# Patient Record
Sex: Female | Born: 1990 | Race: White | Hispanic: Yes | Marital: Single | Smoking: Never smoker
Health system: Southern US, Community
[De-identification: ages and names within clinical notes are randomized; demographics above are authoritative.]

## PROBLEM LIST (undated history)

## (undated) DIAGNOSIS — F419 Anxiety disorder, unspecified: Secondary | ICD-10-CM

## (undated) DIAGNOSIS — H539 Unspecified visual disturbance: Secondary | ICD-10-CM

## (undated) DIAGNOSIS — R569 Unspecified convulsions: Secondary | ICD-10-CM

## (undated) HISTORY — DX: Unspecified convulsions: R56.9

## (undated) HISTORY — DX: Unspecified visual disturbance: H53.9

## (undated) HISTORY — PX: WISDOM TOOTH EXTRACTION: SHX21

## (undated) HISTORY — PX: TONSILLECTOMY AND ADENOIDECTOMY: SUR1326

## (undated) HISTORY — DX: Anxiety disorder, unspecified: F41.9

---

## 2012-10-10 ENCOUNTER — Emergency Department: Payer: Self-pay | Admitting: Emergency Medicine

## 2013-02-22 ENCOUNTER — Emergency Department: Payer: Self-pay | Admitting: Emergency Medicine

## 2013-02-22 LAB — CBC
HCT: 38.6 % (ref 35.0–47.0)
HGB: 13.1 g/dL (ref 12.0–16.0)
MCH: 29.6 pg (ref 26.0–34.0)
MCHC: 34 g/dL (ref 32.0–36.0)
MCV: 87 fL (ref 80–100)
Platelet: 329 10*3/uL (ref 150–440)
WBC: 6.6 10*3/uL (ref 3.6–11.0)

## 2013-02-22 LAB — COMPREHENSIVE METABOLIC PANEL
Alkaline Phosphatase: 73 U/L (ref 50–136)
Anion Gap: 7 (ref 7–16)
BUN: 8 mg/dL (ref 7–18)
Bilirubin,Total: 0.5 mg/dL (ref 0.2–1.0)
Calcium, Total: 9.7 mg/dL (ref 8.5–10.1)
Chloride: 102 mmol/L (ref 98–107)
Co2: 27 mmol/L (ref 21–32)
EGFR (African American): >60
Osmolality: 272 (ref 275–301)
Sodium: 136 mmol/L (ref 136–145)
Total Protein: 7.7 g/dL (ref 6.4–8.2)

## 2013-02-22 LAB — URINALYSIS, COMPLETE
Bilirubin,UR: NEGATIVE
Blood: NEGATIVE
Nitrite: NEGATIVE
RBC,UR: 1 /HPF (ref 0–5)

## 2013-02-22 LAB — LIPASE, BLOOD: Lipase: 90 U/L (ref 73–393)

## 2013-02-22 LAB — WET PREP, GENITAL

## 2013-02-22 LAB — GC/CHLAMYDIA PROBE AMP

## 2013-07-01 ENCOUNTER — Ambulatory Visit: Payer: Self-pay | Admitting: Obstetrics and Gynecology

## 2013-07-08 ENCOUNTER — Ambulatory Visit: Payer: Self-pay | Admitting: Obstetrics and Gynecology

## 2013-08-08 ENCOUNTER — Ambulatory Visit: Payer: Self-pay | Admitting: Obstetrics and Gynecology

## 2013-08-18 ENCOUNTER — Ambulatory Visit: Payer: Self-pay | Admitting: Physician Assistant

## 2013-08-28 ENCOUNTER — Inpatient Hospital Stay: Payer: Self-pay

## 2013-08-28 LAB — CBC WITH DIFFERENTIAL/PLATELET
BASOS ABS: 0.1 10*3/uL (ref 0.0–0.1)
Basophil %: 1 %
EOS PCT: 0.9 %
Eosinophil #: 0.1 10*3/uL (ref 0.0–0.7)
HCT: 41.1 % (ref 35.0–47.0)
HGB: 13.8 g/dL (ref 12.0–16.0)
LYMPHS ABS: 1.9 10*3/uL (ref 1.0–3.6)
LYMPHS PCT: 27.9 %
MCH: 29.6 pg (ref 26.0–34.0)
MCHC: 33.6 g/dL (ref 32.0–36.0)
MCV: 88 fL (ref 80–100)
Monocyte #: 0.4 x10 3/mm (ref 0.2–0.9)
Monocyte %: 6 %
NEUTROS PCT: 64.2 %
Neutrophil #: 4.3 10*3/uL (ref 1.4–6.5)
PLATELETS: 305 10*3/uL (ref 150–440)
RBC: 4.66 10*6/uL (ref 3.80–5.20)
RDW: 12.8 % (ref 11.5–14.5)
WBC: 6.8 10*3/uL (ref 3.6–11.0)

## 2013-08-28 LAB — PROTEIN, URINE, 24 HOUR
Collection Hours: 15 hours
PROTEIN, 24 HOUR URINE: 1756 mg/(24.h) — AB (ref 30–149)
Protein, Urine: 118 mg/dL (ref 0–12)
Total Volume: 930 mL

## 2013-08-28 LAB — PROTEIN / CREATININE RATIO, URINE
Creatinine, Urine: 139.6 mg/dL — ABNORMAL HIGH (ref 30.0–125.0)
PROTEIN/CREAT. RATIO: 458 mg/g{creat} — AB (ref 0–200)
Protein, Random Urine: 64 mg/dL — ABNORMAL HIGH (ref 0–12)

## 2013-08-29 LAB — PLATELET COUNT: Platelet: 280 10*3/uL (ref 150–440)

## 2013-08-29 LAB — GC/CHLAMYDIA PROBE AMP

## 2013-08-30 LAB — HEMATOCRIT: HCT: 37.2 % (ref 35.0–47.0)

## 2013-10-11 LAB — HM PAP SMEAR: HM Pap smear: NORMAL
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
## 2014-08-05 ENCOUNTER — Ambulatory Visit: Payer: Self-pay | Admitting: Orthopedic Surgery

## 2014-12-16 NOTE — H&P (Signed)
L&D Evaluation:  History:  HPI 24 year old G1 P0 with EDC=09/16/2013 by LMP=12/10/2012 presents from office at 37 2/7 weeks for a PIH evaluation. Had BPs yesterday of 146/90 and 150/80 and +1 proteinuria. BPs today range from 114/67 to 150/84. [place] labs from 1/20 reveal H&H of 13.8/39.6, plt=316K, alk phos 726, ALT=61, AST=27, uric acid+5.8, BUN=10 and cr=0.6. She denies headaches, visual changes, RUQ pain, CP, or visual changes. AFI on 1/16 was 7.91 cm. NSTs have been reactive in office and here in L&D today. PNC at [facility] also remarkable for obesity (starting BMI=37), GDMA2 (currently takes glyburide q evening with supper) that has been fairly well controlled (has not been checking FSBS as often as she should recently), and a recent URI for which she is taking Amoxicillin 500 tid, Phenergan DM for cough for the past 5 days. The last growth scan at 35 weeks EFW at 6#2oz (53%). LABS: O POS, VI, RNI, GBS negative. TDAP given 12/1. Desires to breast feed   Presents with elevated BPs for PIH eval   Patient's Medical History Obesity, GDMA2, anxiety, mild depression    Patient's Surgical History Tonsilectomy    Medications Pre Natal Vitamins  Amoxicillin 500 mgm tid, Phenergan DM, glyburide 2.5 mgm with evening meal    Allergies Motrin-hives   Social History none    Family History Non-Contributory    ROS:  ROS see HPI   Exam:  Vital Signs 125/58, 116/71, 114/67, 150/84, 139/75, 123/61    Urine Protein 1+, P/C ratio was 458 mgm and a 24 hr protein extrapolated from a 15 hr specimen was 1756 mgm   General no apparent distress   Mental Status clear    Chest clear    Heart normal sinus rhythm, no murmur/gallop/rubs   Abdomen gravid, non-tender   Estimated Fetal Weight Average for gestational age, 7#   Fetal Position cephalic   Edema trace    Reflexes 1+    Pelvic no external lesions, 1.5cm/75%/-1 to 0   Mebranes Intact   FHT 140 baseline with accels to 160s-Cat1   Ucx irregular,  mild   Skin dry   Impression:  Impression reactive NST, IUP at 37 2/7 weeks with mild preeclampsia   Plan:  Plan other, Discussed with patient diagnosis and risks of continued observation vs the risks of IOL. Discussed possible sequelae from preeclampsia as well as risks of IOL including  hyperstimulation, FITL, failed IOL, C-section.   Comments Also discussed small risks of prematurity issues at 37 weeks with respiratory problems, poor sucking, glucose control. Patient agrees with moving her toward delivery. Plan to let he eat supper and insert Cervidil after supper. Check 2 hr pp after supper.Discussed POM with Dr Glennon Mac.   Electronic Signatures: Karene Fry (CNM)  (Signed 22-Jan-15 00:14)  Authored: L&D Evaluation   Last Updated: 22-Jan-15 00:14 by Karene Fry (CNM)

## 2017-01-05 ENCOUNTER — Ambulatory Visit (INDEPENDENT_AMBULATORY_CARE_PROVIDER_SITE_OTHER): Payer: Self-pay | Admitting: Neurology

## 2017-01-05 ENCOUNTER — Encounter (INDEPENDENT_AMBULATORY_CARE_PROVIDER_SITE_OTHER): Payer: Self-pay

## 2017-01-05 ENCOUNTER — Encounter: Payer: Self-pay | Admitting: Neurology

## 2017-01-05 DIAGNOSIS — R404 Transient alteration of awareness: Secondary | ICD-10-CM | POA: Insufficient documentation

## 2017-01-05 DIAGNOSIS — G4719 Other hypersomnia: Secondary | ICD-10-CM

## 2017-01-05 DIAGNOSIS — R0683 Snoring: Secondary | ICD-10-CM | POA: Insufficient documentation
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
## 2017-02-09 ENCOUNTER — Other Ambulatory Visit: Payer: Self-pay

## 2017-03-02 ENCOUNTER — Ambulatory Visit (INDEPENDENT_AMBULATORY_CARE_PROVIDER_SITE_OTHER): Payer: 59

## 2017-03-02 DIAGNOSIS — R4182 Altered mental status, unspecified: Secondary | ICD-10-CM | POA: Diagnosis not present

## 2017-03-02 DIAGNOSIS — R404 Transient alteration of awareness: Secondary | ICD-10-CM

## 2017-03-07 NOTE — Progress Notes (Signed)
   [facility]  EEG (ELECTROENCEPHALOGRAM) REPORT   STUDY DATE: 03/02/2017 PATIENT NAME: Kristen PoundKathleen Westerfield DOB: 06/13/1991 MRN: [id number]  ORDERING CLINICIAN: Richard A. Epimenio FootSater, MD. PhD  TECHNOLOGIST: Elvis CoilNick Willard TECHNIQUE: Electroencephalogram was recorded utilizing standard 10-20 system of lead placement and reformatted into average and bipolar montages.  RECORDING TIME: 22:18  CLINICAL INFORMATION: 26 year old woman with seizure-like activity  FINDINGS: A digital EEG was performed while the patient was awake and drowsy. While awake and most alert there was a 10.5 hz posterior dominant rhythm. Voltages and frequencies were symmetric.  There were no focal, lateralizing, epileptiform activity or seizures seen.  Photic stimulation had a normal driving response. Hyperventilation and recovery did not change the underlying rhythms. EKG channel shows NSR.   Stage N1 and N2 sleep was recorded.  IMPRESSION: This is a normal EEG with the patient was awake and asleep.   INTERPRETING PHYSICIAN:   Richard A. Epimenio FootSater, MD, PhD, [facility]AN Certified in Neurology, Clinical Neurophysiology, Sleep Medicine, Pain Medicine and Neuroimaging  [facility] [address], [address] [place], [place] [address] [phone]

## 2017-03-08 ENCOUNTER — Telehealth: Payer: Self-pay | Admitting: *Deleted

## 2017-03-08 NOTE — Telephone Encounter (Signed)
I have spoken with Kristen Ponce this afternoon and per RAS, advised EEG done in our office is normal.  She is not yet cleared to drive; will discuss further at next ov.  Pt. verbalized understanding of same/fim

## 2017-12-07 ENCOUNTER — Ambulatory Visit: Payer: Self-pay | Admitting: Advanced Practice Midwife

## 2017-12-08 ENCOUNTER — Ambulatory Visit (INDEPENDENT_AMBULATORY_CARE_PROVIDER_SITE_OTHER): Payer: 59 | Admitting: Advanced Practice Midwife

## 2017-12-08 ENCOUNTER — Encounter: Payer: Self-pay | Admitting: Advanced Practice Midwife

## 2017-12-08 VITALS — BP 124/76 | Ht 67.0 in | Wt 252.0 lb

## 2017-12-08 DIAGNOSIS — Z113 Encounter for screening for infections with a predominantly sexual mode of transmission: Secondary | ICD-10-CM

## 2017-12-08 DIAGNOSIS — Z124 Encounter for screening for malignant neoplasm of cervix: Secondary | ICD-10-CM | POA: Diagnosis not present

## 2017-12-08 DIAGNOSIS — Z01419 Encounter for gynecological examination (general) (routine) without abnormal findings: Secondary | ICD-10-CM | POA: Diagnosis not present

## 2017-12-08 NOTE — Patient Instructions (Addendum)
Health Maintenance, Female Adopting a healthy lifestyle and getting preventive care can go a long way to promote health and wellness. Talk with your health care provider about what schedule of regular examinations is right for you. This is a good chance for you to check in with your provider about disease prevention and staying healthy. In between checkups, there are plenty of things you can do on your own. Experts have done a lot of research about which lifestyle changes and preventive measures are most likely to keep you healthy. Ask your health care provider for more information. Weight and diet Eat a healthy diet  Be sure to include plenty of vegetables, fruits, low-fat dairy products, and lean protein.  Do not eat a lot of foods high in solid fats, added sugars, or salt.  Get regular exercise. This is one of the most important things you can do for your health. ? Most adults should exercise for at least 150 minutes each week. The exercise should increase your heart rate and make you sweat (moderate-intensity exercise). ? Most adults should also do strengthening exercises at least twice a week. This is in addition to the moderate-intensity exercise.  Maintain a healthy weight  Body mass index (BMI) is a measurement that can be used to identify possible weight problems. It estimates body fat based on height and weight. Your health care provider can help determine your BMI and help you achieve or maintain a healthy weight.  For females 69 years of age and older: ? A BMI below 18.5 is considered underweight. ? A BMI of 18.5 to 24.9 is normal. ? A BMI of 25 to 29.9 is considered overweight. ? A BMI of 30 and above is considered obese.  Watch levels of cholesterol and blood lipids  You should start having your blood tested for lipids and cholesterol at 26 years of age, then have this test every 5 years.  You may need to have your cholesterol levels checked more often if: ? Your lipid or  cholesterol levels are high. ? You are older than 26 years of age. ? You are at high risk for heart disease.  Cancer screening Lung Cancer  Lung cancer screening is recommended for adults 70-27 years old who are at high risk for lung cancer because of a history of smoking.  A yearly low-dose CT scan of the lungs is recommended for people who: ? Currently smoke. ? Have quit within the past 15 years. ? Have at least a 30-pack-year history of smoking. A pack year is smoking an average of one pack of cigarettes a day for 1 year.  Yearly screening should continue until it has been 15 years since you quit.  Yearly screening should stop if you develop a health problem that would prevent you from having lung cancer treatment.  Breast Cancer  Practice breast self-awareness. This means understanding how your breasts normally appear and feel.  It also means doing regular breast self-exams. Let your health care provider know about any changes, no matter how small.  If you are in your 20s or 30s, you should have a clinical breast exam (CBE) by a health care provider every 1-3 years as part of a regular health exam.  If you are 68 or older, have a CBE every year. Also consider having a breast X-ray (mammogram) every year.  If you have a family history of breast cancer, talk to your health care provider about genetic screening.  If you are at high risk  for breast cancer, talk to your health care provider about having an MRI and a mammogram every year.  Breast cancer gene (BRCA) assessment is recommended for women who have family members with BRCA-related cancers. BRCA-related cancers include: ? Breast. ? Ovarian. ? Tubal. ? Peritoneal cancers.  Results of the assessment will determine the need for genetic counseling and BRCA1 and BRCA2 testing.  Cervical Cancer Your health care provider may recommend that you be screened regularly for cancer of the pelvic organs (ovaries, uterus, and  vagina). This screening involves a pelvic examination, including checking for microscopic changes to the surface of your cervix (Pap test). You may be encouraged to have this screening done every 3 years, beginning at age 22.  For women ages 56-65, health care providers may recommend pelvic exams and Pap testing every 3 years, or they may recommend the Pap and pelvic exam, combined with testing for human papilloma virus (HPV), every 5 years. Some types of HPV increase your risk of cervical cancer. Testing for HPV may also be done on women of any age with unclear Pap test results.  Other health care providers may not recommend any screening for nonpregnant women who are considered low risk for pelvic cancer and who do not have symptoms. Ask your health care provider if a screening pelvic exam is right for you.  If you have had past treatment for cervical cancer or a condition that could lead to cancer, you need Pap tests and screening for cancer for at least 20 years after your treatment. If Pap tests have been discontinued, your risk factors (such as having a new sexual partner) need to be reassessed to determine if screening should resume. Some women have medical problems that increase the chance of getting cervical cancer. In these cases, your health care provider may recommend more frequent screening and Pap tests.  Colorectal Cancer  This type of cancer can be detected and often prevented.  Routine colorectal cancer screening usually begins at 26 years of age and continues through 26 years of age.  Your health care provider may recommend screening at an earlier age if you have risk factors for colon cancer.  Your health care provider may also recommend using home test kits to check for hidden blood in the stool.  A small camera at the end of a tube can be used to examine your colon directly (sigmoidoscopy or colonoscopy). This is done to check for the earliest forms of colorectal  cancer.  Routine screening usually begins at age 33.  Direct examination of the colon should be repeated every 5-10 years through 26 years of age. However, you may need to be screened more often if early forms of precancerous polyps or small growths are found.  Skin Cancer  Check your skin from head to toe regularly.  Tell your health care provider about any new moles or changes in moles, especially if there is a change in a mole's shape or color.  Also tell your health care provider if you have a mole that is larger than the size of a pencil eraser.  Always use sunscreen. Apply sunscreen liberally and repeatedly throughout the day.  Protect yourself by wearing long sleeves, pants, a wide-brimmed hat, and sunglasses whenever you are outside.  Heart disease, diabetes, and high blood pressure  High blood pressure causes heart disease and increases the risk of stroke. High blood pressure is more likely to develop in: ? People who have blood pressure in the high end of  the normal range (130-139/85-89 mm Hg). ? People who are overweight or obese. ? People who are African American.  If you are 21-29 years of age, have your blood pressure checked every 3-5 years. If you are 3 years of age or older, have your blood pressure checked every year. You should have your blood pressure measured twice-once when you are at a hospital or clinic, and once when you are not at a hospital or clinic. Record the average of the two measurements. To check your blood pressure when you are not at a hospital or clinic, you can use: ? An automated blood pressure machine at a pharmacy. ? A home blood pressure monitor.  If you are between 17 years and 37 years old, ask your health care provider if you should take aspirin to prevent strokes.  Have regular diabetes screenings. This involves taking a blood sample to check your fasting blood sugar level. ? If you are at a normal weight and have a low risk for diabetes,  have this test once every three years after 26 years of age. ? If you are overweight and have a high risk for diabetes, consider being tested at a younger age or more often. Preventing infection Hepatitis B  If you have a higher risk for hepatitis B, you should be screened for this virus. You are considered at high risk for hepatitis B if: ? You were born in a country where hepatitis B is common. Ask your health care provider which countries are considered high risk. ? Your parents were born in a high-risk country, and you have not been immunized against hepatitis B (hepatitis B vaccine). ? You have HIV or AIDS. ? You use needles to inject street drugs. ? You live with someone who has hepatitis B. ? You have had sex with someone who has hepatitis B. ? You get hemodialysis treatment. ? You take certain medicines for conditions, including cancer, organ transplantation, and autoimmune conditions.  Hepatitis C  Blood testing is recommended for: ? Everyone born from [birth year] through 1964. ? Anyone with known risk factors for hepatitis C.  Sexually transmitted infections (STIs)  You should be screened for sexually transmitted infections (STIs) including gonorrhea and chlamydia if: ? You are sexually active and are younger than 26 years of age. ? You are older than 26 years of age and your health care provider tells you that you are at risk for this type of infection. ? Your sexual activity has changed since you were last screened and you are at an increased risk for chlamydia or gonorrhea. Ask your health care provider if you are at risk.  If you do not have HIV, but are at risk, it may be recommended that you take a prescription medicine daily to prevent HIV infection. This is called pre-exposure prophylaxis (PrEP). You are considered at risk if: ? You are sexually active and do not regularly use condoms or know the HIV status of your partner(s). ? You take drugs by injection. ? You are  sexually active with a partner who has HIV.  Talk with your health care provider about whether you are at high risk of being infected with HIV. If you choose to begin PrEP, you should first be tested for HIV. You should then be tested every 3 months for as long as you are taking PrEP. Pregnancy  If you are premenopausal and you may become pregnant, ask your health care provider about preconception counseling.  If you may become  pregnant, take 400 to 800 micrograms (mcg) of folic acid every day.  If you want to prevent pregnancy, talk to your health care provider about birth control (contraception). Osteoporosis and menopause  Osteoporosis is a disease in which the bones lose minerals and strength with aging. This can result in serious bone fractures. Your risk for osteoporosis can be identified using a bone density scan.  If you are 52 years of age or older, or if you are at risk for osteoporosis and fractures, ask your health care provider if you should be screened.  Ask your health care provider whether you should take a calcium or vitamin D supplement to lower your risk for osteoporosis.  Menopause may have certain physical symptoms and risks.  Hormone replacement therapy may reduce some of these symptoms and risks. Talk to your health care provider about whether hormone replacement therapy is right for you. Follow these instructions at home:  Schedule regular health, dental, and eye exams.  Stay current with your immunizations.  Do not use any tobacco products including cigarettes, chewing tobacco, or electronic cigarettes.  If you are pregnant, do not drink alcohol.  If you are breastfeeding, limit how much and how often you drink alcohol.  Limit alcohol intake to no more than 1 drink per day for nonpregnant women. One drink equals 12 ounces of beer, 5 ounces of wine, or 1 ounces of hard liquor.  Do not use street drugs.  Do not share needles.  Ask your health care  provider for help if you need support or information about quitting drugs.  Tell your health care provider if you often feel depressed.  Tell your health care provider if you have ever been abused or do not feel safe at home. This information is not intended to replace advice given to you by your health care provider. Make sure you discuss any questions you have with your health care provider. Document Released: 02/07/2011 Document Revised: 12/31/2015 Document Reviewed: 04/28/2015 Elsevier Interactive Patient Education  2018 [employer].     Why follow it? Research shows. . Those who follow the Mediterranean diet have a reduced risk of heart disease  . The diet is associated with a reduced incidence of Parkinson's and Alzheimer's diseases . People following the diet may have longer life expectancies and lower rates of chronic diseases  . The Dietary Guidelines for Americans recommends the Mediterranean diet as an eating plan to promote health and prevent disease  What Is the Mediterranean Diet?  . Healthy eating plan based on typical foods and recipes of Mediterranean-style cooking . The diet is primarily a plant based diet; these foods should make up a majority of meals   Starches - Plant based foods should make up a majority of meals - They are an important sources of vitamins, minerals, energy, antioxidants, and fiber - Choose whole grains, foods high in fiber and minimally processed items  - Typical grain sources include wheat, oats, barley, corn, brown rice, bulgar, farro, millet, polenta, couscous  - Various types of beans include chickpeas, lentils, fava beans, black beans, white beans   Fruits  Veggies - Large quantities of antioxidant rich fruits & veggies; 6 or more servings  - Vegetables can be eaten raw or lightly drizzled with oil and cooked  - Vegetables common to the traditional Mediterranean Diet include: artichokes, arugula, beets, broccoli, brussel sprouts, cabbage,  carrots, celery, collard greens, cucumbers, eggplant, kale, leeks, lemons, lettuce, mushrooms, okra, onions, peas, peppers, potatoes, pumpkin, radishes, rutabaga,  shallots, spinach, sweet potatoes, turnips, zucchini - Fruits common to the Mediterranean Diet include: apples, apricots, avocados, cherries, clementines, dates, figs, grapefruits, grapes, melons, nectarines, oranges, peaches, pears, pomegranates, strawberries, tangerines  Fats - Replace butter and margarine with healthy oils, such as olive oil, canola oil, and tahini  - Limit nuts to no more than a handful a day  - Nuts include walnuts, almonds, pecans, pistachios, pine nuts  - Limit or avoid candied, honey roasted or heavily salted nuts - Olives are central to the [employer] - can be eaten whole or used in a variety of dishes   Meats Protein - Limiting red meat: no more than a few times a month - When eating red meat: choose lean cuts and keep the portion to the size of deck of cards - Eggs: approx. 0 to 4 times a week  - Fish and lean poultry: at least 2 a week  - Healthy protein sources include, chicken, [place], lean beef, lamb - Increase intake of seafood such as tuna, salmon, trout, mackerel, shrimp, scallops - Avoid or limit high fat processed meats such as sausage and bacon  Dairy - Include moderate amounts of low fat dairy products  - Focus on healthy dairy such as fat free yogurt, skim milk, low or reduced fat cheese - Limit dairy products higher in fat such as whole or 2% milk, cheese, ice cream  Alcohol - Moderate amounts of red wine is ok  - No more than 5 oz daily for women (all ages) and men older than age 18  - No more than 10 oz of wine daily for men younger than 98  Other - Limit sweets and other desserts  - Use herbs and spices instead of salt to flavor foods  - Herbs and spices common to the traditional Mediterranean Diet include: basil, bay leaves, chives, cloves, cumin, fennel, garlic, lavender, marjoram,  mint, oregano, parsley, pepper, rosemary, sage, savory, sumac, tarragon, thyme   It's not just a diet, it's a lifestyle:  . The Mediterranean diet includes lifestyle factors typical of those in the region  . Foods, drinks and meals are best eaten with others and savored . Daily physical activity is important for overall good health . This could be strenuous exercise like running and aerobics . This could also be more leisurely activities such as walking, housework, yard-work, or taking the stairs . Moderation is the key; a balanced and healthy diet accommodates most foods and drinks . Consider portion sizes and frequency of consumption of certain foods   Meal Ideas & Options:  . Breakfast:  o Whole wheat toast or whole wheat English muffins with peanut butter & hard boiled egg o Steel cut oats topped with apples & cinnamon and skim milk  o Fresh fruit: banana, strawberries, melon, berries, peaches  o Smoothies: strawberries, bananas, greek yogurt, peanut butter o Low fat greek yogurt with blueberries and granola  o Egg white omelet with spinach and mushrooms o Breakfast couscous: whole wheat couscous, apricots, skim milk, cranberries  . Sandwiches:  o Hummus and grilled vegetables (peppers, zucchini, squash) on whole wheat bread   o Grilled chicken on whole wheat pita with lettuce, tomatoes, cucumbers or tzatziki  o Tuna salad on whole wheat bread: tuna salad made with greek yogurt, olives, red peppers, capers, green onions o Garlic rosemary lamb pita: lamb sauted with garlic, rosemary, salt & pepper; add lettuce, cucumber, greek yogurt to pita - flavor with lemon juice and black pepper  .  Seafood:  o Mediterranean grilled salmon, seasoned with garlic, basil, parsley, lemon juice and black pepper o Shrimp, lemon, and spinach whole-grain pasta salad made with low fat greek yogurt  o Seared scallops with lemon orzo  o Seared tuna steaks seasoned salt, pepper, coriander topped with tomato  mixture of olives, tomatoes, olive oil, minced garlic, parsley, green onions and cappers  . Meats:  o Herbed greek chicken salad with kalamata olives, cucumber, feta  o Red bell peppers stuffed with spinach, bulgur, lean ground beef (or lentils) & topped with feta   o Kebabs: skewers of chicken, tomatoes, onions, zucchini, squash  o Turkey burgers: made with red onions, mint, dill, lemon juice, feta cheese topped with roasted red peppers . Vegetarian o Cucumber salad: cucumbers, artichoke hearts, celery, red onion, feta cheese, tossed in olive oil & lemon juice  o Hummus and whole grain pita points with a greek salad (lettuce, tomato, feta, olives, cucumbers, red onion) o Lentil soup with celery, carrots made with vegetable broth, garlic, salt and pepper  o Tabouli salad: parsley, bulgur, mint, scallions, cucumbers, tomato, radishes, lemon juice, olive oil, salt and pepper.      American Heart Association (AHA) Exercise Recommendation  Being physically active is important to prevent heart disease and stroke, the nation's No. 1and No. 5killers. To improve overall cardiovascular health, we suggest at least 150 minutes per week of moderate exercise or 75 minutes per week of vigorous exercise (or a combination of moderate and vigorous activity). Thirty minutes a day, five times a week is an easy goal to remember. You will also experience benefits even if you divide your time into two or three segments of 10 to 15 minutes per day.  For people who would benefit from lowering their blood pressure or cholesterol, we recommend 40 minutes of aerobic exercise of moderate to vigorous intensity three to four times a week to lower the risk for heart attack and stroke.  Physical activity is anything that makes you move your body and burn calories.  This includes things like climbing stairs or playing sports. Aerobic exercises benefit your heart, and include walking, jogging, swimming or biking. Strength and  stretching exercises are best for overall stamina and flexibility.  The simplest, positive change you can make to effectively improve your heart health is to start walking. It's enjoyable, free, easy, social and great exercise. A walking program is flexible and boasts high success rates because people can stick with it. It's easy for walking to become a regular and satisfying part of life.   For Overall Cardiovascular Health:  At least 30 minutes of moderate-intensity aerobic activity at least 5 days per week for a total of 150  OR   At least 25 minutes of vigorous aerobic activity at least 3 days per week for a total of 75 minutes; or a combination of moderate- and vigorous-intensity aerobic activity  AND   Moderate- to high-intensity muscle-strengthening activity at least 2 days per week for additional health benefits.  For Lowering Blood Pressure and Cholesterol  An average 40 minutes of moderate- to vigorous-intensity aerobic activity 3 or 4 times per week  What if I can't make it to the time goal? Something is always better than nothing! And everyone has to start somewhere. Even if you've been sedentary for years, today is the day you can begin to make healthy changes in your life. If you don't think you'll make it for 30 or 40 minutes, set a reachable goal for   today. You can work up toward your overall goal by increasing your time as you get stronger. Don't let all-or-nothing thinking rob you of doing what you can every day.  Source:http://www.heart.org    Diabetes Mellitus and Exercise Exercising regularly is important for your overall health, especially when you have diabetes (diabetes mellitus). Exercising is not only about losing weight. It has many health benefits, such as increasing muscle strength and bone density and reducing body fat and stress. This leads to improved fitness, flexibility, and endurance, all of which result in better overall health. Exercise has  additional benefits for people with diabetes, including:  Reducing appetite.  Helping to lower and control blood glucose.  Lowering blood pressure.  Helping to control amounts of fatty substances (lipids) in the blood, such as cholesterol and triglycerides.  Helping the body to respond better to insulin (improving insulin sensitivity).  Reducing how much insulin the body needs.  Decreasing the risk for heart disease by: ? Lowering cholesterol and triglyceride levels. ? Increasing the levels of good cholesterol. ? Lowering blood glucose levels.  What is my activity plan? Your health care provider or certified diabetes educator can help you make a plan for the type and frequency of exercise (activity plan) that works for you. Make sure that you:  Do at least 150 minutes of moderate-intensity or vigorous-intensity exercise each week. This could be brisk walking, biking, or water aerobics. ? Do stretching and strength exercises, such as yoga or weightlifting, at least 2 times a week. ? Spread out your activity over at least 3 days of the week.  Get some form of physical activity every day. ? Do not go more than 2 days in a row without some kind of physical activity. ? Avoid being inactive for more than 90 minutes at a time. Take frequent breaks to walk or stretch.  Choose a type of exercise or activity that you enjoy, and set realistic goals.  Start slowly, and gradually increase the intensity of your exercise over time.  What do I need to know about managing my diabetes?  Check your blood glucose before and after exercising. ? If your blood glucose is higher than 240 mg/dL (13.3 mmol/L) before you exercise, check your urine for ketones. If you have ketones in your urine, do not exercise until your blood glucose returns to normal.  Know the symptoms of low blood glucose (hypoglycemia) and how to treat it. Your risk for hypoglycemia increases during and after exercise. Common  symptoms of hypoglycemia can include: ? Hunger. ? Anxiety. ? Sweating and feeling clammy. ? Confusion. ? Dizziness or feeling light-headed. ? Increased heart rate or palpitations. ? Blurry vision. ? Tingling or numbness around the mouth, lips, or tongue. ? Tremors or shakes. ? Irritability.  Keep a rapid-acting carbohydrate snack available before, during, and after exercise to help prevent or treat hypoglycemia.  Avoid injecting insulin into areas of the body that are going to be exercised. For example, avoid injecting insulin into: ? The arms, when playing tennis. ? The legs, when jogging.  Keep records of your exercise habits. Doing this can help you and your health care provider adjust your diabetes management plan as needed. Write down: ? Food that you eat before and after you exercise. ? Blood glucose levels before and after you exercise. ? The type and amount of exercise you have done. ? When your insulin is expected to peak, if you use insulin. Avoid exercising at times when your insulin is peaking.  When you start a new exercise or activity, work with your health care provider to make sure the activity is safe for you, and to adjust your insulin, medicines, or food intake as needed.  Drink plenty of water while you exercise to prevent dehydration or heat stroke. Drink enough fluid to keep your urine clear or pale yellow. This information is not intended to replace advice given to you by your health care provider. Make sure you discuss any questions you have with your health care provider. Document Released: 10/15/2003 Document Revised: 02/12/2016 Document Reviewed: 01/04/2016 Elsevier Interactive Patient Education  2018 [employer].  Diabetes Mellitus and Nutrition When you have diabetes (diabetes mellitus), it is very important to have healthy eating habits because your blood sugar (glucose) levels are greatly affected by what you eat and drink. Eating healthy foods in the  appropriate amounts, at about the same times every day, can help you:  Control your blood glucose.  Lower your risk of heart disease.  Improve your blood pressure.  Reach or maintain a healthy weight.  Every person with diabetes is different, and each person has different needs for a meal plan. Your health care provider may recommend that you work with a diet and nutrition specialist (dietitian) to make a meal plan that is best for you. Your meal plan may vary depending on factors such as:  The calories you need.  The medicines you take.  Your weight.  Your blood glucose, blood pressure, and cholesterol levels.  Your activity level.  Other health conditions you have, such as heart or kidney disease.  How do carbohydrates affect me? Carbohydrates affect your blood glucose level more than any other type of food. Eating carbohydrates naturally increases the amount of glucose in your blood. Carbohydrate counting is a method for keeping track of how many carbohydrates you eat. Counting carbohydrates is important to keep your blood glucose at a healthy level, especially if you use insulin or take certain oral diabetes medicines. It is important to know how many carbohydrates you can safely have in each meal. This is different for every person. Your dietitian can help you calculate how many carbohydrates you should have at each meal and for snack. Foods that contain carbohydrates include:  Bread, cereal, rice, pasta, and crackers.  Potatoes and corn.  Peas, beans, and lentils.  Milk and yogurt.  Fruit and juice.  Desserts, such as cakes, cookies, ice cream, and candy.  How does alcohol affect me? Alcohol can cause a sudden decrease in blood glucose (hypoglycemia), especially if you use insulin or take certain oral diabetes medicines. Hypoglycemia can be a life-threatening condition. Symptoms of hypoglycemia (sleepiness, dizziness, and confusion) are similar to symptoms of having  too much alcohol. If your health care provider says that alcohol is safe for you, follow these guidelines:  Limit alcohol intake to no more than 1 drink per day for nonpregnant women and 2 drinks per day for men. One drink equals 12 oz of beer, 5 oz of wine, or 1 oz of hard liquor.  Do not drink on an empty stomach.  Keep yourself hydrated with water, diet soda, or unsweetened iced tea.  Keep in mind that regular soda, juice, and other mixers may contain a lot of sugar and must be counted as carbohydrates.  What are tips for following this plan? Reading food labels  Start by checking the serving size on the label. The amount of calories, carbohydrates, fats, and other nutrients listed on the label  are based on one serving of the food. Many foods contain more than one serving per package.  Check the total grams (g) of carbohydrates in one serving. You can calculate the number of servings of carbohydrates in one serving by dividing the total carbohydrates by 15. For example, if a food has 30 g of total carbohydrates, it would be equal to 2 servings of carbohydrates.  Check the number of grams (g) of saturated and trans fats in one serving. Choose foods that have low or no amount of these fats.  Check the number of milligrams (mg) of sodium in one serving. Most people should limit total sodium intake to less than 2,300 mg per day.  Always check the nutrition information of foods labeled as "low-fat" or "nonfat". These foods may be higher in added sugar or refined carbohydrates and should be avoided.  Talk to your dietitian to identify your daily goals for nutrients listed on the label. Shopping  Avoid buying canned, premade, or processed foods. These foods tend to be high in fat, sodium, and added sugar.  Shop around the outside edge of the grocery store. This includes fresh fruits and vegetables, bulk grains, fresh meats, and fresh dairy. Cooking  Use low-heat cooking methods, such as  baking, instead of high-heat cooking methods like deep frying.  Cook using healthy oils, such as olive, canola, or sunflower oil.  Avoid cooking with butter, cream, or high-fat meats. Meal planning  Eat meals and snacks regularly, preferably at the same times every day. Avoid going long periods of time without eating.  Eat foods high in fiber, such as fresh fruits, vegetables, beans, and whole grains. Talk to your dietitian about how many servings of carbohydrates you can eat at each meal.  Eat 4-6 ounces of lean protein each day, such as lean meat, chicken, fish, eggs, or tofu. 1 ounce is equal to 1 ounce of meat, chicken, or fish, 1 egg, or 1/4 cup of tofu.  Eat some foods each day that contain healthy fats, such as avocado, nuts, seeds, and fish. Lifestyle   Check your blood glucose regularly.  Exercise at least 30 minutes 5 or more days each week, or as told by your health care provider.  Take medicines as told by your health care provider.  Do not use any products that contain nicotine or tobacco, such as cigarettes and e-cigarettes. If you need help quitting, ask your health care provider.  Work with a Social worker or diabetes educator to identify strategies to manage stress and any emotional and social challenges. What are some questions to ask my health care provider?  Do I need to meet with a diabetes educator?  Do I need to meet with a dietitian?  What number can I call if I have questions?  When are the best times to check my blood glucose? Where to find more information:  American Diabetes Association: diabetes.org/food-and-fitness/food  Academy of Nutrition and Dietetics: PokerClues.dk  [employer] of Diabetes and Digestive and Kidney Diseases (NIH): ContactWire.be Summary  A healthy meal plan will help you control your blood glucose  and maintain a healthy lifestyle.  Working with a diet and nutrition specialist (dietitian) can help you make a meal plan that is best for you.  Keep in mind that carbohydrates and alcohol have immediate effects on your blood glucose levels. It is important to count carbohydrates and to use alcohol carefully. This information is not intended to replace advice given to you by your health care provider.  Make sure you discuss any questions you have with your health care provider. Document Released: 04/21/2005 Document Revised: 08/29/2016 Document Reviewed: 08/29/2016 Elsevier Interactive Patient Education  [employer].

## 2017-12-08 NOTE — Progress Notes (Signed)
Patient ID: Kristen Ponce, female   DOB: Mar 06, 1991, 27 y.o.   MRN: [id number]     Gynecology Annual Exam  PCP: Evelene Croon, MD  Chief Complaint:  Chief Complaint  Patient presents with  . Annual Exam    History of Present Illness: Patient is a 27 y.o. G1P1001 presents for annual exam. The patient has complaint today of nipple discharge. About 2 weeks ago she noticed her nipples were dry and when she squeezed them she had a small amount of pus like discharge. She was unable to produce enough milk for her baby 4 years ago and is confused about why she has discharge now. She was diagnosed with diabetes 5 months ago. She denies receiving instruction on healthy lifestyle changes. She is currently taking Metformin, Tradjenta and Actos for blood sugar control. She admits history of ovarian cysts and hirsutism.   LMP: Patient's last menstrual period was 11/17/2017. Menarche:not applicable Average Interval: regular, 28 days Duration of flow: 5 days Heavy Menses: no Clots: no Intermenstrual Bleeding: no Postcoital Bleeding: no Dysmenorrhea: no  The patient is sexually active. She currently uses condoms for contraception. She denies dyspareunia.  The patient does not perform self breast exams.  There is no notable family history of breast or ovarian cancer in her family.  The patient wears seatbelts: yes.  The patient has regular exercise: yes.    The patient denies current symptoms of depression.    Review of Systems: Review of Systems  Constitutional: Negative.   HENT: Negative.   Eyes: Negative.   Respiratory: Negative.   Cardiovascular: Negative.   Gastrointestinal: Negative.   Genitourinary: Negative.   Musculoskeletal: Negative.   Skin: Negative.   Neurological: Negative.   Endo/Heme/Allergies: Negative.   Psychiatric/Behavioral: Negative.     Past Medical History:  Past Medical History:  Diagnosis Date  . Anxiety   . Seizures (HCC)   . Vision abnormalities       Past Surgical History:  Past Surgical History:  Procedure Laterality Date  . TONSILLECTOMY AND ADENOIDECTOMY    . WISDOM TOOTH EXTRACTION      Gynecologic History:  Patient's last menstrual period was 11/17/2017. Contraception: condoms Last Pap: 4 years ago Results were:  no abnormalities   Obstetric History: G1P1001  Family History:  Family History  Problem Relation Age of Onset  . Healthy Mother   . Healthy Father   . Diabetes Mellitus II Maternal Grandmother   . Diabetes Mellitus II Maternal Grandfather   . Diabetes Mellitus II Paternal Grandmother   . Diabetes Mellitus II Paternal Grandfather     Social History:  Social History   Socioeconomic History  . Marital status: Single    Spouse name: Not on file  . Number of children: Not on file  . Years of education: Not on file  . Highest education level: Not on file  Occupational History  . Not on file  Social Needs  . Financial resource strain: Not on file  . Food insecurity:    Worry: Not on file    Inability: Not on file  . Transportation needs:    Medical: Not on file    Non-medical: Not on file  Tobacco Use  . Smoking status: Never Smoker  . Smokeless tobacco: Never Used  Substance and Sexual Activity  . Alcohol use: Yes  . Drug use: Never  . Sexual activity: Not Currently    Birth control/protection: None  Lifestyle  . Physical activity:    Days per week: Not  on file    Minutes per session: Not on file  . Stress: Not on file  Relationships  . Social connections:    Talks on phone: Not on file    Gets together: Not on file    Attends religious service: Not on file    Active member of club or organization: Not on file    Attends meetings of clubs or organizations: Not on file    Relationship status: Not on file  . Intimate partner violence:    Fear of current or ex partner: Not on file    Emotionally abused: Not on file    Physically abused: Not on file    Forced sexual activity: Not on  file  Other Topics Concern  . Not on file  Social History Narrative  . Not on file    Allergies:  Allergies  Allergen Reactions  . Motrin [Ibuprofen] Hives    Medications: Prior to Admission medications   Medication Sig Start Date End Date Taking? Authorizing Provider  linagliptin (TRADJENTA) 5 MG TABS tablet Take 5 mg by mouth daily.   Yes [provider]  lisinopril (PRINIVIL,ZESTRIL) 5 MG tablet Take 5 mg by mouth daily.   Yes [provider]  metFORMIN (GLUCOPHAGE) 1000 MG tablet Take 1,000 mg by mouth 2 (two) times daily with a meal.   Yes [provider]  pioglitazone (ACTOS) 30 MG tablet Take 30 mg by mouth daily.   Yes [provider]  pravastatin (PRAVACHOL) 20 MG tablet Take 20 mg by mouth daily.   Yes [provider]    Physical Exam Vitals: Blood pressure 124/76, height  (1.702 m), weight 252 lb (114.3 kg), last menstrual period 11/17/2017.  General: NAD HEENT: normocephalic, anicteric Thyroid: no enlargement, no palpable nodules Pulmonary: No increased work of breathing, CTAB Cardiovascular: RRR, distal pulses 2+ Breast: Breast symmetrical, no tenderness, no palpable nodules or masses, no skin or nipple retraction present, no nipple discharge.  No axillary or supraclavicular lymphadenopathy. Abdomen: NABS, soft, non-tender, non-distended.  Umbilicus without lesions.  No hepatomegaly, splenomegaly or masses palpable. No evidence of hernia  Genitourinary:  External: Normal external female genitalia.  Normal urethral meatus, normal Bartholin's and Skene's glands.    Vagina: Normal vaginal mucosa, no evidence of prolapse.    Cervix: Grossly normal in appearance, friable, no CMT  Uterus: Non-enlarged, mobile, normal contour.    Adnexa: ovaries non-enlarged, no adnexal masses  Rectal: deferred  Lymphatic: no evidence of inguinal lymphadenopathy Extremities: no edema, erythema, or tenderness Neurologic: Grossly  intact Psychiatric: mood appropriate, affect full   Assessment: 27 y.o. G1P1001 routine annual exam  Plan: Problem List Items Addressed This Visit    None    Visit Diagnoses    Well woman exam with routine gynecological exam    -  Primary   Relevant Orders   IGP,CtNgTv,rfx Aptima HPV ASCU   Screen for sexually transmitted diseases       Relevant Orders   IGP,CtNgTv,rfx Aptima HPV ASCU   Hepatitis B surface antibody   Hepatitis C antibody   HIV antibody   HSV 2 antibody, IgG   RPR Qual   Cervical cancer screening       Relevant Orders   IGP,CtNgTv,rfx Aptima HPV ASCU      1) 4) Gardasil Series discussed and if applicable offered to patient - Patient has not previously completed 3 shot series   2) STI screening  wasoffered and accepted  3)  ASCCP guidelines and  rational discussed.  Patient opts for every 3 years screening interval  4) Contraception - the patient is currently using  condoms.  She is interested in changing to IUD. Information given today and told to call for appointment if she decides she wants the IUD. We discussed safe sex practices to reduce her furture risk of STI's.   5) Encouraged to increase healthy lifestyle and Diabetes instructions given to patient   6) Return in 1 year (on 12/09/2018) for annual established gyn.   Tresea Mall, CNM [facility], [facility] 12/08/2017, 3:28 PM

## 2017-12-09 LAB — HSV-2 IGG SUPPLEMENTAL TEST: HSV-2 IgG Supplemental Test: NEGATIVE

## 2017-12-09 LAB — HEPATITIS B SURFACE ANTIBODY,QUALITATIVE: HEP B SURFACE AB, QUAL: NONREACTIVE

## 2017-12-09 LAB — HSV 2 ANTIBODY, IGG: HSV 2 IGG, TYPE SPEC: 2.81 {index} — AB (ref 0.00–0.90)

## 2017-12-09 LAB — HEPATITIS C ANTIBODY: Hep C Virus Ab: <0.1 s/co ratio (ref 0.0–0.9)

## 2017-12-09 LAB — HIV ANTIBODY (ROUTINE TESTING W REFLEX): HIV SCREEN 4TH GENERATION: NONREACTIVE

## 2017-12-09 LAB — RPR QUALITATIVE: RPR Ser Ql: NONREACTIVE

## 2017-12-14 LAB — IGP,CTNGTV,RFX APTIMA HPV ASCU
Chlamydia, Nuc. Acid Amp: NEGATIVE
Gonococcus, Nuc. Acid Amp: NEGATIVE
PAP SMEAR COMMENT: 0
Trich vag by NAA: NEGATIVE

## 2024-04-25 ENCOUNTER — Ambulatory Visit (INDEPENDENT_AMBULATORY_CARE_PROVIDER_SITE_OTHER): Payer: Self-pay | Admitting: Physician Assistant

## 2024-04-25 ENCOUNTER — Encounter: Payer: Self-pay | Admitting: Physician Assistant

## 2024-04-25 DIAGNOSIS — E119 Type 2 diabetes mellitus without complications: Secondary | ICD-10-CM | POA: Diagnosis not present

## 2024-04-25 DIAGNOSIS — F419 Anxiety disorder, unspecified: Secondary | ICD-10-CM

## 2024-04-25 DIAGNOSIS — Z7689 Persons encountering health services in other specified circumstances: Secondary | ICD-10-CM

## 2024-04-25 DIAGNOSIS — M545 Low back pain, unspecified: Secondary | ICD-10-CM | POA: Diagnosis not present

## 2024-04-25 NOTE — Progress Notes (Signed)
 New patient visit  Patient: Kristen Ponce   DOB: 05/10/1991   33 y.o. Female  MRN: [id number] Visit Date: 04/25/2024  Today's healthcare provider: Sophiana Milanese, PA-C   Chief Complaint  Patient presents with   New Patient (Initial Visit)    Patient is present to establish care and would like to discuss going back on insulin as she has been off it for 1 year and she would also like to discuss back pain that has been present since July   Back Pain    Present since being sick in July. All other symptoms went away except for back pain located in mid-lower back area. She does not take any form of medication for pain    Medication Assistance    Patient was taking humalog kwikpen 10 ML and has not taken any since last PCP office closed. She would like to restart as she is type 2. She reports rx was covered by [facility] previously but would like pharmacy assistance if she can receive it.    Subjective    Kristen Ponce is a 33 y.o. female who presents today as a new patient to establish care.  HPI HPI     New Patient (Initial Visit)    Additional comments: Patient is present to establish care and would like to discuss going back on insulin as she has been off it for 1 year and she would also like to discuss back pain that has been present since July        Back Pain    Additional comments: Present since being sick in July. All other symptoms went away except for back pain located in mid-lower back area. She does not take any form of medication for pain         Medication Assistance    Additional comments: Patient was taking humalog kwikpen 10 ML and has not taken any since last PCP office closed. She would like to restart as she is type 2. She reports rx was covered by [facility] pharmacy previously but would like pharmacy assistance if she can receive it.       Last edited by Lilian Fitzpatrick, CMA on 04/25/2024  2:20 PM.      *** Discussed the use of AI scribe software for  clinical note transcription with the patient, who gave verbal consent to proceed.  History of Present Illness       04/25/2024    2:22 PM  PHQ9 SCORE ONLY  PHQ-9 Total Score 9      04/25/2024    2:22 PM  GAD 7 : Generalized Anxiety Score  Nervous, Anxious, on Edge 3  Control/stop worrying 3  Worry too much - different things 3  Trouble relaxing 3  Restless 1  Easily annoyed or irritable 2  Afraid - awful might happen 1  Total GAD 7 Score 16  Anxiety Difficulty Somewhat difficult     Past Medical History:  Diagnosis Date   Anxiety    Seizures (HCC)    Vision abnormalities    Past Surgical History:  Procedure Laterality Date   TONSILLECTOMY AND ADENOIDECTOMY     WISDOM TOOTH EXTRACTION     Family Status  Relation Name Status   Mother  Alive   Father  Alive   MGM  (Not Specified)   MGF  (Not Specified)   PGM  (Not Specified)   PGF  (Not Specified)  No partnership data on file   Family History  Problem Relation Age  of Onset   Healthy Mother    Healthy Father    Diabetes Mellitus II Maternal Grandmother    Diabetes Mellitus II Maternal Grandfather    Diabetes Mellitus II Paternal Grandmother    Diabetes Mellitus II Paternal Grandfather    Social History   Socioeconomic History   Marital status: Single    Spouse name: Not on file   Number of children: Not on file   Years of education: Not on file   Highest education level: Not on file  Occupational History   Not on file  Tobacco Use   Smoking status: Never   Smokeless tobacco: Never  Substance and Sexual Activity   Alcohol use: Yes   Drug use: Never   Sexual activity: Not Currently    Birth control/protection: None  Other Topics Concern   Not on file  Social History Narrative   Not on file   Social Drivers of Health   Financial Resource Strain: Not on file  Food Insecurity: Not on file  Transportation Needs: Not on file  Physical Activity: Not on file  Stress: Not on file  Social  Connections: Not on file   Outpatient Medications Prior to Visit  Medication Sig   linagliptin (TRADJENTA) 5 MG TABS tablet Take 5 mg by mouth daily. (Patient not taking: Reported on 04/25/2024)   lisinopril (PRINIVIL,ZESTRIL) 5 MG tablet Take 5 mg by mouth daily. (Patient not taking: Reported on 04/25/2024)   metFORMIN (GLUCOPHAGE) 1000 MG tablet Take 1,000 mg by mouth 2 (two) times daily with a meal. (Patient not taking: Reported on 04/25/2024)   pioglitazone (ACTOS) 30 MG tablet Take 30 mg by mouth daily. (Patient not taking: Reported on 04/25/2024)   pravastatin (PRAVACHOL) 20 MG tablet Take 20 mg by mouth daily. (Patient not taking: Reported on 04/25/2024)   No facility-administered medications prior to visit.   Allergies  Allergen Reactions   Motrin [Ibuprofen] Hives    Immunization History  Administered Date(s) Administered   Dtap, Unspecified 05/11/1992, 09/15/1992, 12/11/1992, 07/21/1993, 06/13/1997   Hep B, Unspecified 1992/04/02, 05/11/1992, 10/09/1992   Influenza,inj,Quad PF,6+ Mos 08/27/2021   MMR 03/11/1993, 06/13/1997   Moderna Sars-Covid-2 Vaccination 10/21/2019, 11/18/2019   PFIZER Comirnaty(Gray Top)Covid-19 Tri-Sucrose Vaccine 09/18/2020   Polio, Unspecified 05/11/1992, 09/15/1992, 12/11/1992, 06/13/1997   Tdap 03/24/2017    Health Maintenance  Topic Date Due   HPV VACCINES (1 - 3-dose SCDM series) Never done   Cervical Cancer Screening (HPV/Pap Cotest)  03/09/2022   COVID-19 Vaccine (4 - 2025-26 season) 04/08/2024   Influenza Vaccine  11/05/2024 (Originally 03/08/2024)   DTaP/Tdap/Td (7 - Td or Tdap) 03/25/2027   Hepatitis C Screening  Completed   HIV Screening  Completed   Pneumococcal Vaccine  Aged Out   Meningococcal B Vaccine  Aged Out    Patient Care Team: Mariany Mackintosh, PA-C as PCP - General (Physician Assistant)  Review of Systems Except see HPI   {Insert previous labs (optional):23779} {See past labs  Heme  Chem  Endocrine  Serology  Results  Review (optional):1}   Objective    BP 120/80 (BP Location: Left Arm, Patient Position: Sitting, Cuff Size: Normal)   Pulse 90   Ht 5' 7 (1.702 m)   Wt 241 lb (109.3 kg)   LMP 04/08/2024   SpO2 94%   BMI 37.75 kg/m  {Insert last BP/Wt (optional):23777}{See vitals history (optional):1}   Physical Exam  Depression Screen    04/25/2024    2:22 PM  PHQ 2/9 Scores  PHQ -  2 Score 2  PHQ- 9 Score 9   No results found for any visits on 04/25/24.  Assessment & Plan     *** Assessment and Plan Assessment & Plan      Encounter to establish care Welcomed to our clinic Reviewed past medical hx, social hx, family hx and surgical hx Pt advised to send all vaccination records or screening   No follow-ups on file.    The patient was advised to call back or seek an in-person evaluation if the symptoms worsen or if the condition fails to improve as anticipated.  I discussed the assessment and treatment plan with the patient. The patient was provided an opportunity to ask questions and all were answered. The patient agreed with the plan and demonstrated an understanding of the instructions.  I, Batina Dougan, PA-C have reviewed all documentation for this visit. The documentation on  04/25/2024   for the exam, diagnosis, procedures, and orders are all accurate and complete.  Jolynn Spencer, [facility], MMS [facility] [phone] (phone) [phone] (fax)  [facility]

## 2024-05-23 ENCOUNTER — Ambulatory Visit: Payer: Self-pay | Admitting: Physician Assistant

## 2024-05-28 ENCOUNTER — Ambulatory Visit: Payer: Self-pay | Admitting: Physician Assistant

## 2024-05-30 ENCOUNTER — Ambulatory Visit: Payer: Self-pay | Admitting: Physician Assistant

## 2024-05-30 ENCOUNTER — Encounter: Payer: Self-pay | Admitting: Physician Assistant

## 2024-05-30 ENCOUNTER — Ambulatory Visit
Admission: RE | Admit: 2024-05-30 | Discharge: 2024-05-30 | Disposition: A | Source: Ambulatory Visit | Attending: Physician Assistant

## 2024-05-30 ENCOUNTER — Ambulatory Visit
Admission: RE | Admit: 2024-05-30 | Discharge: 2024-05-30 | Disposition: A | Discharge status: Home / Self Care | Attending: Physician Assistant | Admitting: Physician Assistant

## 2024-05-30 VITALS — BP 121/71 | HR 84 | Resp 14 | Ht 67.0 in | Wt 241.7 lb

## 2024-05-30 DIAGNOSIS — Z01419 Encounter for gynecological examination (general) (routine) without abnormal findings: Secondary | ICD-10-CM | POA: Diagnosis not present

## 2024-05-30 DIAGNOSIS — N39498 Other specified urinary incontinence: Secondary | ICD-10-CM | POA: Diagnosis not present

## 2024-05-30 DIAGNOSIS — M545 Low back pain, unspecified: Secondary | ICD-10-CM | POA: Insufficient documentation

## 2024-05-30 DIAGNOSIS — R350 Frequency of micturition: Secondary | ICD-10-CM

## 2024-05-30 DIAGNOSIS — G8929 Other chronic pain: Secondary | ICD-10-CM

## 2024-05-30 MED ORDER — CELECOXIB 200 MG PO CAPS: 200.0000 mg | ORAL_CAPSULE | Freq: Two times a day (BID) | ORAL | 1 refills | Status: AC
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
## 2024-05-31 ENCOUNTER — Ambulatory Visit: Payer: Self-pay | Admitting: Physician Assistant

## 2024-05-31 DIAGNOSIS — E119 Type 2 diabetes mellitus without complications: Secondary | ICD-10-CM

## 2024-05-31 LAB — HEMOGLOBIN A1C
Est. average glucose Bld gHb Est-mCnc: 301 mg/dL
Hgb A1c MFr Bld: 12.1 % — ABNORMAL HIGH (ref 4.8–5.6)

## 2024-05-31 LAB — COMPREHENSIVE METABOLIC PANEL WITH GFR
ALT: 77 IU/L — ABNORMAL HIGH (ref 0–32)
AST: 44 IU/L — ABNORMAL HIGH (ref 0–40)
Albumin: 4.6 g/dL (ref 3.9–4.9)
Alkaline Phosphatase: 105 IU/L (ref 41–116)
BUN/Creatinine Ratio: 15 (ref 9–23)
BUN: 10 mg/dL (ref 6–20)
Bilirubin Total: 0.7 mg/dL (ref 0.0–1.2)
CO2: 25 mmol/L (ref 20–29)
Calcium: 9.9 mg/dL (ref 8.7–10.2)
Chloride: 97 mmol/L (ref 96–106)
Creatinine, Ser: 0.67 mg/dL (ref 0.57–1.00)
Globulin, Total: 2.9 g/dL (ref 1.5–4.5)
Glucose: 321 mg/dL — ABNORMAL HIGH (ref 70–99)
Potassium: 4.6 mmol/L (ref 3.5–5.2)
Sodium: 136 mmol/L (ref 134–144)
Total Protein: 7.5 g/dL (ref 6.0–8.5)
eGFR: 119 mL/min/1.73 (ref >59)

## 2024-05-31 LAB — LIPID PANEL
Chol/HDL Ratio: 4.5 ratio — ABNORMAL HIGH (ref 0.0–4.4)
Cholesterol, Total: 192 mg/dL (ref 100–199)
HDL: 43 mg/dL (ref >39)
LDL Chol Calc (NIH): 123 mg/dL — ABNORMAL HIGH (ref 0–99)
Triglycerides: 148 mg/dL (ref 0–149)
VLDL Cholesterol Cal: 26 mg/dL (ref 5–40)

## 2024-05-31 LAB — MICROALBUMIN / CREATININE URINE RATIO
Creatinine, Urine: 178.6 mg/dL
Microalb/Creat Ratio: 18 mg/g{creat} (ref 0–29)
Microalbumin, Urine: 31.9 ug/mL

## 2024-05-31 LAB — CBC WITH DIFFERENTIAL/PLATELET
Basophils Absolute: 0 x10E3/uL (ref 0.0–0.2)
Basos: 1 %
EOS (ABSOLUTE): 0.1 x10E3/uL (ref 0.0–0.4)
Eos: 2 %
Hematocrit: 47.1 % — ABNORMAL HIGH (ref 34.0–46.6)
Hemoglobin: 15.2 g/dL (ref 11.1–15.9)
Immature Grans (Abs): 0 x10E3/uL (ref 0.0–0.1)
Immature Granulocytes: 0 %
Lymphocytes Absolute: 2.3 x10E3/uL (ref 0.7–3.1)
Lymphs: 43 %
MCH: 29.5 pg (ref 26.6–33.0)
MCHC: 32.3 g/dL (ref 31.5–35.7)
MCV: 92 fL (ref 79–97)
Monocytes Absolute: 0.3 x10E3/uL (ref 0.1–0.9)
Monocytes: 6 %
Neutrophils Absolute: 2.6 x10E3/uL (ref 1.4–7.0)
Neutrophils: 48 %
Platelets: 306 x10E3/uL (ref 150–450)
RBC: 5.15 x10E6/uL (ref 3.77–5.28)
RDW: 12 % (ref 11.7–15.4)
WBC: 5.3 x10E3/uL (ref 3.4–10.8)

## 2024-05-31 LAB — TSH: TSH: 1.41 u[IU]/mL (ref 0.450–4.500)

## 2024-05-31 LAB — URINALYSIS, ROUTINE W REFLEX MICROSCOPIC
Bilirubin, UA: NEGATIVE
Leukocytes,UA: NEGATIVE
Nitrite, UA: NEGATIVE
RBC, UA: NEGATIVE
Specific Gravity, UA: >=1.03 — AB (ref 1.005–1.030)
Urobilinogen, Ur: 0.2 mg/dL (ref 0.2–1.0)
pH, UA: 5 (ref 5.0–7.5)

## 2024-06-04 ENCOUNTER — Ambulatory Visit: Admitting: Physical Therapy

## 2024-06-05 MED ORDER — METFORMIN HCL ER 500 MG PO TB24: 500.0000 mg | ORAL_TABLET | Freq: Every day | ORAL | 1 refills | Status: DC

## 2024-06-07 ENCOUNTER — Telehealth: Payer: Self-pay

## 2024-06-07 DIAGNOSIS — E119 Type 2 diabetes mellitus without complications: Secondary | ICD-10-CM

## 2024-06-07 DIAGNOSIS — M545 Low back pain, unspecified: Secondary | ICD-10-CM | POA: Diagnosis not present

## 2024-06-07 NOTE — Telephone Encounter (Signed)
 Copied from CRM #[id number]. Topic: Clinical - Medication Question >> Jun 07, 2024  8:23 AM Thersia BROCKS wrote: Reason for RMF:Ejupzwu called in would like to see if there is any alternative metFORMIN (GLUCOPHAGE-XR) 500 MG 24 hr tablet , patient she has tried it and it doesn't do good with her, would like to know if something else could be called in

## 2024-06-10 ENCOUNTER — Ambulatory Visit: Admitting: Physical Therapy

## 2024-06-12 ENCOUNTER — Ambulatory Visit: Admitting: Physical Therapy

## 2024-06-12 MED ORDER — FREESTYLE LIBRE 3 PLUS SENSOR MISC: 11 refills | Status: DC

## 2024-06-12 MED ORDER — INSULIN GLARGINE 100 UNIT/ML SOLOSTAR PEN: 10.0000 [IU] | PEN_INJECTOR | Freq: Every day | SUBCUTANEOUS | 1 refills | Status: DC

## 2024-06-12 MED ORDER — GLIPIZIDE 5 MG PO TABS: 5.0000 mg | ORAL_TABLET | Freq: Two times a day (BID) | ORAL | 0 refills | Status: DC

## 2024-06-12 NOTE — Telephone Encounter (Unsigned)
 Copied from CRM #[id number]. Topic: Clinical - Medication Question >> Jun 07, 2024  8:23 AM Thersia BROCKS wrote: Reason for RMF:Ejupzwu called in would like to see if there is any alternative metFORMIN (GLUCOPHAGE-XR) 500 MG 24 hr tablet , patient she has tried it and it doesn't do good with her, would like to know if something else could be called in >> Jun 12, 2024  9:05 AM Gustabo D wrote: Please give the pt a call back today she says its been 3 days and no has reached out to her about her call from last week

## 2024-06-12 NOTE — Addendum Note (Signed)
 Addended by: Vrishank Moster on: 06/12/2024 11:45 AM   Modules accepted: Orders

## 2024-06-12 NOTE — Telephone Encounter (Signed)
 Noted

## 2024-06-12 NOTE — Addendum Note (Signed)
 Addended by: Morey Andonian on: 06/12/2024 03:01 PM   Modules accepted: Orders

## 2024-06-13 ENCOUNTER — Telehealth: Payer: Self-pay

## 2024-06-13 NOTE — Progress Notes (Signed)
 Care Guide Pharmacy Note  06/13/2024 Name: Aneri Slagel MRN: [id number] DOB: September 18, 1990  Referred By: Ostwalt, Janna, PA-C Reason for referral: Complex Care Management and Call Attempt #1 (Successful initial outreach scheduled with PHARM D-Allyson)   Tanasia Budzinski is a 33 y.o. year old female who is a primary care patient of Ostwalt, Janna, PA-C.  Tanai Bouler was referred to the pharmacist for assistance related to: DMII  Successful contact was made with the patient to discuss pharmacy services including being ready for the pharmacist to call at least 5 minutes before the scheduled appointment time and to have medication bottles and any blood pressure readings ready for review. The patient agreed to meet with the pharmacist via In Office on (date/time). On 07/10/24 @ 2 PM  Leotis Rase [facility], [facility] Guide  Direct Dial: [phone]  Fax [phone]

## 2024-06-17 ENCOUNTER — Other Ambulatory Visit (HOSPITAL_COMMUNITY): Payer: Self-pay

## 2024-06-17 ENCOUNTER — Telehealth: Payer: Self-pay

## 2024-06-17 NOTE — Telephone Encounter (Signed)
 Looking up insurance information using verify rx benefts.

## 2024-06-25 ENCOUNTER — Ambulatory Visit: Admitting: Physical Therapy

## 2024-06-27 ENCOUNTER — Ambulatory Visit: Admitting: Physical Therapy

## 2024-06-29 NOTE — Progress Notes (Signed)
 " Established patient visit  Patient: Kristen Ponce   DOB: Aug 30, 1990   33 y.o. Female  MRN: [id number] Visit Date: 07/01/2024  Today's healthcare provider: Jolynn Spencer, PA-C   Chief Complaint  Patient presents with   Medical Management of Chronic Issues    Eye exam- scheduled for February. 1 mont f/u- pt reports no changes    Subjective      Discussed the use of AI scribe software for clinical note transcription with the patient, who gave verbal consent to proceed.  History of Present Illness Kristen Ponce is a 33 year old female with diabetes who presents for management of her condition.  She was recently diagnosed with diabetes, with A1c 12.1% in October and blood sugar 321 mg/dL on 89/76. Labs also showed elevated liver enzymes and LDL 123 mg/dL, with no protein in her urine. She takes metformin  500 mg, glipizide  5 mg twice daily, and Lantus  10 units daily but has not been checking blood sugars at home.  She has chronic lower back pain that is currently controlled with Tylenol and Celebrex . She has started physical therapy but has not yet seen orthopedics.  She has urinary incontinence that has improved, though she has not yet scheduled further evaluation.  She denies vision changes, chest pain, resting shortness of breath, palpitations, bowel changes, nausea, vomiting, or other acute symptoms. She had brief shortness of breath and nausea last week during a cough, now resolved.      04/25/2024    2:22 PM  Depression screen PHQ 2/9  Decreased Interest 1  Down, Depressed, Hopeless 1  PHQ - 2 Score 2  Altered sleeping 2  Tired, decreased energy 2  Change in appetite 0  Feeling bad or failure about yourself  1  Trouble concentrating 0  Moving slowly or fidgety/restless 2  Suicidal thoughts 0  PHQ-9 Score 9   Difficult doing work/chores Somewhat difficult     Data saved with a previous flowsheet row definition      04/25/2024    2:22 PM  GAD 7 : Generalized  Anxiety Score  Nervous, Anxious, on Edge 3  Control/stop worrying 3  Worry too much - different things 3  Trouble relaxing 3  Restless 1  Easily annoyed or irritable 2  Afraid - awful might happen 1  Total GAD 7 Score 16  Anxiety Difficulty Somewhat difficult    Medications: Outpatient Medications Prior to Visit  Medication Sig   celecoxib  (CELEBREX ) 200 MG capsule Take 1 capsule (200 mg total) by mouth 2 (two) times daily.   insulin  glargine (LANTUS ) 100 UNIT/ML Solostar Pen Inject 10 Units into the skin daily.   [DISCONTINUED] Continuous Glucose Sensor (FREESTYLE LIBRE 3 PLUS SENSOR) MISC Change sensor every 15 days.   [DISCONTINUED] glipiZIDE  (GLUCOTROL ) 5 MG tablet Take 1 tablet (5 mg total) by mouth 2 (two) times daily before a meal.   [DISCONTINUED] metFORMIN  (GLUCOPHAGE -XR) 500 MG 24 hr tablet Take 1 tablet (500 mg total) by mouth daily with breakfast.   No facility-administered medications prior to visit.    Review of Systems  All other systems reviewed and are negative.  All negative Except see HPI       Objective    BP 122/70   Pulse 89   Resp 14   Ht 5' 7 (1.702 m)   Wt 243 lb 12.8 oz (110.6 kg)   LMP 05/23/2024   SpO2 99%   BMI 38.18 kg/m     Physical Exam Vitals reviewed.  Constitutional:      General: She is not in acute distress.    Appearance: Normal appearance. She is well-developed. She is obese. She is not diaphoretic.  HENT:     Head: Normocephalic and atraumatic.  Eyes:     General: No scleral icterus.    Conjunctiva/sclera: Conjunctivae normal.  Neck:     Thyroid: No thyromegaly.  Cardiovascular:     Rate and Rhythm: Normal rate and regular rhythm.     Pulses: Normal pulses.     Heart sounds: Normal heart sounds. No murmur heard. Pulmonary:     Effort: Pulmonary effort is normal. No respiratory distress.     Breath sounds: Normal breath sounds. No wheezing, rhonchi or rales.  Musculoskeletal:     Cervical back: Neck supple.      Right lower leg: No edema.     Left lower leg: No edema.  Lymphadenopathy:     Cervical: No cervical adenopathy.  Skin:    General: Skin is warm and dry.     Findings: No rash.  Neurological:     Mental Status: She is alert and oriented to person, place, and time. Mental status is at baseline.  Psychiatric:        Mood and Affect: Mood normal.        Behavior: Behavior normal.      No results found for any visits on 07/01/24.      Assessment & Plan Type 2 diabetes mellitus with hyperglycemia Recently diagnosed with type 2 diabetes mellitus.  A1c was 12.1% in October, indicating poor glycemic control. Blood sugar was 321 mg/dL on October 76mi. No proteinuria. Current regimen includes Lantus . Discussed dietary changes and exercise. Considered continuous glucose monitoring with Libre. Potential need for increased insulin  dosage based on glucose readings. - Prescribed continuous glucose monitor (Libre)/sample. - Ordered comprehensive metabolic panel to assess glucose, liver enzymes, and kidney function. - Advised low-carb diet and regular exercise. - Educated on the importance of monitoring blood glucose levels. - Discussed potential need for increased insulin  dosage based on glucose readings.  Low back pain Chronic low back pain. Referred to orthopedics and physical therapy. Reports improvement with current medication regimen including Tylenol and Celebrex . - Continue current pain management with Tylenol and Celebrex . - Encouraged physical therapy and orthopedic follow-up.  Urinary frequency Reports improvement in urinary frequency. Discussed potential benefits of Kegel exercises and pelvic floor physical therapy if symptoms persist. - Recommended Kegel exercises. - Will consider referral to pelvic floor physical therapy if symptoms persist. Follow-up with gynecology/urology  Elevated cholesterol levels LDL cholesterol level was 876 mg/dL, above the target of less than 70 mg/dL.  Emphasized the importance of dietary changes to manage cholesterol levels. Discussed potential need for medication if dietary changes are insufficient. - Advised strict dietary changes to lower cholesterol. - Will consider medication if dietary changes are insufficient.  Abnormal liver enzymes Elevated liver enzymes noted, possibly related to uncontrolled diabetes.  - Focus on diabetes management to potentially improve liver enzyme levels. - advised healthy diet and regular exercise/weight loss Will follow-up    Diabetes mellitus without complication (HCC)  - Comprehensive metabolic panel with GFR   Orders Placed This Encounter  Procedures   Comprehensive metabolic panel with GFR    Return in about 4 weeks (around 07/29/2024) for chronic disease f/u.   The patient was advised to call back or seek an in-person evaluation if the symptoms worsen or if the condition fails to improve as anticipated.  I  discussed the assessment and treatment plan with the patient. The patient was provided an opportunity to ask questions and all were answered. The patient agreed with the plan and demonstrated an understanding of the instructions.  I, Leeann Bady, PA-C have reviewed all documentation for this visit. The documentation on 07/01/2024  for the exam, diagnosis, procedures, and orders are all accurate and complete.  Jolynn Spencer, [facility], MMS [facility] [phone] (phone) [phone] (fax)  [facility] "

## 2024-07-01 ENCOUNTER — Ambulatory Visit: Admitting: Physician Assistant

## 2024-07-01 ENCOUNTER — Ambulatory Visit: Admitting: Physical Therapy

## 2024-07-01 ENCOUNTER — Encounter: Payer: Self-pay | Admitting: Physician Assistant

## 2024-07-01 VITALS — BP 122/70 | HR 89 | Resp 14 | Ht 67.0 in | Wt 243.8 lb

## 2024-07-01 DIAGNOSIS — R748 Abnormal levels of other serum enzymes: Secondary | ICD-10-CM

## 2024-07-01 DIAGNOSIS — M545 Low back pain, unspecified: Secondary | ICD-10-CM | POA: Diagnosis not present

## 2024-07-01 DIAGNOSIS — G8929 Other chronic pain: Secondary | ICD-10-CM

## 2024-07-01 DIAGNOSIS — E119 Type 2 diabetes mellitus without complications: Secondary | ICD-10-CM | POA: Diagnosis not present

## 2024-07-01 DIAGNOSIS — E7800 Pure hypercholesterolemia, unspecified: Secondary | ICD-10-CM

## 2024-07-01 DIAGNOSIS — R350 Frequency of micturition: Secondary | ICD-10-CM

## 2024-07-01 MED ORDER — FREESTYLE LIBRE 3 PLUS SENSOR MISC: Status: DC

## 2024-07-02 DIAGNOSIS — E7800 Pure hypercholesterolemia, unspecified: Secondary | ICD-10-CM | POA: Insufficient documentation

## 2024-07-02 DIAGNOSIS — R748 Abnormal levels of other serum enzymes: Secondary | ICD-10-CM | POA: Insufficient documentation

## 2024-07-02 DIAGNOSIS — E119 Type 2 diabetes mellitus without complications: Secondary | ICD-10-CM | POA: Insufficient documentation

## 2024-07-02 DIAGNOSIS — G8929 Other chronic pain: Secondary | ICD-10-CM | POA: Insufficient documentation

## 2024-07-02 DIAGNOSIS — R350 Frequency of micturition: Secondary | ICD-10-CM | POA: Insufficient documentation

## 2024-07-02 LAB — COMPREHENSIVE METABOLIC PANEL WITH GFR
ALT: 60 IU/L — ABNORMAL HIGH (ref 0–32)
AST: 31 IU/L (ref 0–40)
Albumin: 4.3 g/dL (ref 3.9–4.9)
Alkaline Phosphatase: 102 IU/L (ref 41–116)
BUN/Creatinine Ratio: 15 (ref 9–23)
BUN: 8 mg/dL (ref 6–20)
Bilirubin Total: 0.7 mg/dL (ref 0.0–1.2)
CO2: 22 mmol/L (ref 20–29)
Calcium: 9.5 mg/dL (ref 8.7–10.2)
Chloride: 100 mmol/L (ref 96–106)
Creatinine, Ser: 0.55 mg/dL — ABNORMAL LOW (ref 0.57–1.00)
Globulin, Total: 2.8 g/dL (ref 1.5–4.5)
Glucose: 301 mg/dL — ABNORMAL HIGH (ref 70–99)
Potassium: 4.9 mmol/L (ref 3.5–5.2)
Sodium: 138 mmol/L (ref 134–144)
Total Protein: 7.1 g/dL (ref 6.0–8.5)
eGFR: 125 mL/min/1.73 (ref >59)

## 2024-07-03 ENCOUNTER — Ambulatory Visit: Payer: Self-pay | Admitting: Physician Assistant

## 2024-07-09 ENCOUNTER — Ambulatory Visit: Admitting: Physical Therapy

## 2024-07-10 ENCOUNTER — Ambulatory Visit (INDEPENDENT_AMBULATORY_CARE_PROVIDER_SITE_OTHER)

## 2024-07-10 DIAGNOSIS — E119 Type 2 diabetes mellitus without complications: Secondary | ICD-10-CM

## 2024-07-10 DIAGNOSIS — Z794 Long term (current) use of insulin: Secondary | ICD-10-CM | POA: Diagnosis not present

## 2024-07-10 MED ORDER — INSULIN GLARGINE 100 UNIT/ML SOLOSTAR PEN: 18.0000 [IU] | PEN_INJECTOR | Freq: Every day | SUBCUTANEOUS | 2 refills | Status: AC

## 2024-07-10 MED ORDER — FREESTYLE LIBRE 3 PLUS SENSOR MISC: 2 refills | Status: AC

## 2024-07-10 MED ORDER — OZEMPIC (0.25 OR 0.5 MG/DOSE) 2 MG/3ML ~~LOC~~ SOPN: 0.2500 mg | PEN_INJECTOR | SUBCUTANEOUS | 0 refills | Status: DC

## 2024-07-10 NOTE — Progress Notes (Signed)
 S:     Reason for visit: ?  Kristen Ponce is a 33 y.o. female with a history of diabetes (type 2), who presents today for an initial diabetes Face to Face pharmacotherapy visit.?  Care Team: Primary Care Provider: Ostwalt, Janna, PA-C  At last visit with PCP on 07/01/24, patient was instructed to continue to titrate insulin  therapy by 2 units every 3 days for fasting BG above goal.   Patient reports that she had previously received a sample of Wegovy. She denies any ADE from this, but stated it was not covered on insurance so it was not continued.  Patient reports Diabetes was diagnosed in 2014 after a dx of gestational diabetes.   Current diabetes medications include: Lantus  14 units daily Previous diabetes medications include: metformin  (nausea/vomiting)  Patient reports adherence to taking all medications as prescribed.   Have you been experiencing any side effects to the medications prescribed? no Do you have any problems obtaining medications due to transportation or finances? no Insurance coverage: BCBS  Patient denies hypoglycemic events.  Patient denies personal history of pancreatitis or personal or family history of thyroid cancer.  DM Prevention:  Statin: Not taking; .?  ACE/ARB: No Last urinary albumin/creatinine ratio:  Lab Results  Component Value Date   MICRALBCREAT 18 05/30/2024   Last eye exam:     Last foot exam: No foot exam found Tobacco Use:  Tobacco Use: Low Risk  (07/01/2024)   Patient History    Smoking Tobacco Use: Never    Smokeless Tobacco Use: Never    Passive Exposure: Not on file   O:  LibreView Report     Vitals:  Wt Readings from Last 3 Encounters:  07/01/24 243 lb 12.8 oz (110.6 kg)  05/30/24 241 lb 11.2 oz (109.6 kg)  04/25/24 241 lb (109.3 kg)   BP Readings from Last 3 Encounters:  07/01/24 122/70  05/30/24 121/71  04/25/24 120/80   Pulse Readings from Last 3 Encounters:  07/01/24 89  05/30/24 84  04/25/24 90      Labs:?  Lab Results  Component Value Date   HGBA1C 12.1 (H) 05/30/2024   GLUCOSE 301 (H) 07/01/2024   MICRALBCREAT 18 05/30/2024   CREATININE 0.55 (L) 07/01/2024   CREATININE 0.67 05/30/2024   CREATININE 0.70 02/22/2013    Lab Results  Component Value Date   CHOL 192 05/30/2024   LDLCALC 123 (H) 05/30/2024   HDL 43 05/30/2024   TRIG 148 05/30/2024   ALT 60 (H) 07/01/2024   ALT 77 (H) 05/30/2024   AST 31 07/01/2024   AST 44 (H) 05/30/2024      Chemistry      Component Value Date/Time   NA 138 07/01/2024 0945   NA 136 02/22/2013 0034   K 4.9 07/01/2024 0945   K 3.6 02/22/2013 0034   CL 100 07/01/2024 0945   CL 102 02/22/2013 0034   CO2 22 07/01/2024 0945   CO2 27 02/22/2013 0034   BUN 8 07/01/2024 0945   BUN 8 02/22/2013 0034   CREATININE 0.55 (L) 07/01/2024 0945   CREATININE 0.70 02/22/2013 0034      Component Value Date/Time   CALCIUM 9.5 07/01/2024 0945   CALCIUM 9.7 02/22/2013 0034   ALKPHOS 102 07/01/2024 0945   ALKPHOS 73 02/22/2013 0034   AST 31 07/01/2024 0945   AST 25 02/22/2013 0034   ALT 60 (H) 07/01/2024 0945   ALT 61 02/22/2013 0034   BILITOT 0.7 07/01/2024 0945   BILITOT  0.5 02/22/2013 0034       The ASCVD Risk score (Arnett DK, et al., 2019) failed to calculate for the following reasons:   The 2019 ASCVD risk score is only valid for ages 14 to 59  Lab Results  Component Value Date   MICRALBCREAT 18 05/30/2024    A/P: Diabetes currently uncontrolled with a most recent A1c of 12.1% on 05/30/24. Patient is able to verbalize appropriate hypoglycemia management plan. Medication adherence appears appropriate. Patient previously tolerated GLP1 therapy; will restart at this time. BG remains elevated with a GMI of 10.7%. Patient would like to start schedule of weekly injections on Saturday. Will slightly increase insulin  dose as she continues to remain elevated at this time as well. -Increased dose of basal insulin  Lantus  (insulin  glargine)   from 14 to 18 units daily.  -Started GLP-1 Ozempic (semaglutide) 0.25 mg weekly starting 07/13/25 -Patient educated on purpose, proper use, and potential adverse effects of Ozempic.  -Extensively discussed pathophysiology of diabetes, recommended lifestyle interventions, dietary effects on blood sugar control.  -Counseled on s/sx of and management of hypoglycemia.  -Next A1c anticipated 08/2024.   ASCVD risk - primary prevention in patient with diabetes. Last LDL is 123 mg/dL, not at goal of <29 mg/dL. ASCVD risk factors include duration of DM (greater than 10 years). May discuss statin therapy at follow up.    Patient verbalized understanding of treatment plan. Total time patient counseling 45 minutes.  Follow-up:  Pharmacist on 08/05/24 PCP clinic visit on 08/23/24  [name], PharmD, CPP Clinical Pharmacist [facility] [phone]

## 2024-07-11 ENCOUNTER — Ambulatory Visit: Admitting: Physical Therapy

## 2024-07-12 ENCOUNTER — Telehealth: Payer: Self-pay

## 2024-07-12 ENCOUNTER — Other Ambulatory Visit (HOSPITAL_COMMUNITY): Payer: Self-pay

## 2024-07-12 NOTE — Telephone Encounter (Signed)
 Pharmacy Patient Advocate Encounter   Received notification from Onbase that prior authorization for Ozempic  (0.25 or 0.5 MG/DOSE) 2MG /3ML pen-injectors  is required/requested.   Insurance verification completed.   The patient is insured through [employer].   Per test claim: PA required; PA submitted to above mentioned insurance via Latent Key/confirmation #/EOC [id number] Status is pending

## 2024-07-15 ENCOUNTER — Other Ambulatory Visit (HOSPITAL_COMMUNITY): Payer: Self-pay

## 2024-07-15 NOTE — Telephone Encounter (Signed)
 Pharmacy Patient Advocate Encounter  Received notification from [employer] that Prior Authorization for Ozempic  (0.25 or 0.5 MG/DOSE) 2MG /3ML pen-injectors  has been APPROVED from 06/12/24 to 07/12/25. Ran test claim, Copay is $24.99. This test claim was processed through [facility]- copay amounts may vary at other pharmacies due to pharmacy/plan contracts, or as the patient moves through the different stages of their insurance plan.   PA #/Case ID/Reference #: [id number]

## 2024-07-16 ENCOUNTER — Ambulatory Visit: Admitting: Physical Therapy

## 2024-07-18 ENCOUNTER — Ambulatory Visit: Admitting: Physical Therapy

## 2024-08-05 ENCOUNTER — Ambulatory Visit (INDEPENDENT_AMBULATORY_CARE_PROVIDER_SITE_OTHER)

## 2024-08-05 ENCOUNTER — Other Ambulatory Visit: Payer: Self-pay

## 2024-08-05 DIAGNOSIS — Z794 Long term (current) use of insulin: Secondary | ICD-10-CM | POA: Diagnosis not present

## 2024-08-05 DIAGNOSIS — E119 Type 2 diabetes mellitus without complications: Secondary | ICD-10-CM | POA: Diagnosis not present

## 2024-08-05 MED ORDER — OZEMPIC (0.25 OR 0.5 MG/DOSE) 2 MG/3ML ~~LOC~~ SOPN
PEN_INJECTOR | SUBCUTANEOUS | 2 refills | Status: AC
  Filled 2024-08-05: qty 3, 28d supply, fill #0

## 2024-08-05 NOTE — Progress Notes (Signed)
 "  S:     Reason for visit: ?  Kristen Ponce is a 33 y.o. female with a history of diabetes (type 2), who presents today for a follow up diabetes Face to Face pharmacotherapy visit.?  They were referred to the pharmacist by their PCP for assistance in managing diabetes.  Care Team: Primary Care Provider: Ostwalt, Janna, PA-C  At last visit with PCP on 07/01/24, patient was instructed to continue to titrate insulin  therapy by 2 units every 3 days for fasting BG above goal.   At last visit with clinical pharmacist on 07/10/24, patient was started on Ozempic  0.25 mg weekly. She reports previous Wegovy use without any ADE, however, this was discontinued d/t cost.   Today, she reports she was unable to get Ozempic  from her pharmacy, but was not sure what the reason was for this.   Patient reports Diabetes was diagnosed in 2014 after a dx of gestational diabetes.   Current diabetes medications include: Lantus  18 units daily, Ozempic  0.25 mg weekly Previous diabetes medications include: metformin  (nausea/vomiting)  Patient reports adherence to taking all medications as prescribed.   Have you been experiencing any side effects to the medications prescribed? no Do you have any problems obtaining medications due to transportation or finances? no Insurance coverage: BCBS  Patient denies hypoglycemic events.  Patient reports nocturia (nighttime urination).  Patient reports neuropathy (nerve pain).  Patient reported dietary habits: Eats 2 meals/day Breakfast: egg bowl (spinach, mushroom, bacon/sausage) Dinner: soups Snacks: trail mix, cheerios, peanut butter crackers, granola  Drinks: water, coffee - working on limiting sugary drinks  Patient-reported exercise habits: limited d/t 1 hour commute for work; just got a [employer]  Patient denies personal history of pancreatitis or personal or family history of thyroid cancer.  DM Prevention:  Statin: Not taking ACE/ARB: No Last  urinary albumin/creatinine ratio:  Lab Results  Component Value Date   MICRALBCREAT 18 05/30/2024   Last eye exam:     Last foot exam: No foot exam found Tobacco Use:  Tobacco Use: Low Risk (07/01/2024)   Patient History    Smoking Tobacco Use: Never    Smokeless Tobacco Use: Never    Passive Exposure: Not on file   O:  [employer] Report     Vitals:  Wt Readings from Last 3 Encounters:  07/01/24 243 lb 12.8 oz (110.6 kg)  05/30/24 241 lb 11.2 oz (109.6 kg)  04/25/24 241 lb (109.3 kg)   BP Readings from Last 3 Encounters:  07/01/24 122/70  05/30/24 121/71  04/25/24 120/80   Pulse Readings from Last 3 Encounters:  07/01/24 89  05/30/24 84  04/25/24 90     Labs:?  Lab Results  Component Value Date   HGBA1C 12.1 (H) 05/30/2024   GLUCOSE 301 (H) 07/01/2024   MICRALBCREAT 18 05/30/2024   CREATININE 0.55 (L) 07/01/2024   CREATININE 0.67 05/30/2024   CREATININE 0.70 02/22/2013    Lab Results  Component Value Date   CHOL 192 05/30/2024   LDLCALC 123 (H) 05/30/2024   HDL 43 05/30/2024   TRIG 148 05/30/2024   ALT 60 (H) 07/01/2024   ALT 77 (H) 05/30/2024   AST 31 07/01/2024   AST 44 (H) 05/30/2024      Chemistry      Component Value Date/Time   NA 138 07/01/2024 0945   NA 136 02/22/2013 0034   K 4.9 07/01/2024 0945   K 3.6 02/22/2013 0034   CL 100 07/01/2024 0945   CL 102  02/22/2013 0034   CO2 22 07/01/2024 0945   CO2 27 02/22/2013 0034   BUN 8 07/01/2024 0945   BUN 8 02/22/2013 0034   CREATININE 0.55 (L) 07/01/2024 0945   CREATININE 0.70 02/22/2013 0034      Component Value Date/Time   CALCIUM 9.5 07/01/2024 0945   CALCIUM 9.7 02/22/2013 0034   ALKPHOS 102 07/01/2024 0945   ALKPHOS 73 02/22/2013 0034   AST 31 07/01/2024 0945   AST 25 02/22/2013 0034   ALT 60 (H) 07/01/2024 0945   ALT 61 02/22/2013 0034   BILITOT 0.7 07/01/2024 0945   BILITOT 0.5 02/22/2013 0034       The ASCVD Risk score (Arnett DK, et al., 2019) failed to calculate for  the following reasons:   The 2019 ASCVD risk score is only valid for ages 66 to 36  Lab Results  Component Value Date   MICRALBCREAT 18 05/30/2024    A/P: Diabetes currently uncontrolled with a most recent A1c of 12.1% on 05/30/24. Patient is able to verbalize appropriate hypoglycemia management plan. Medication adherence appears appropriate. Patient previously tolerated GLP1 therapy, but was unable to restart this after last visit d/t stock issues at her local pharmacy.  -Continued basal insulin  Lantus  (insulin  glargine)  18 units daily.  -Started GLP-1 Ozempic  (semaglutide ) 0.25 mg weekly and then increase to 0.5 mg weekly after 4 weeks. Sent Rx to [facility].  -Patient educated on purpose, proper use, and potential adverse effects of Ozempic .  -Extensively discussed pathophysiology of diabetes, recommended lifestyle interventions, dietary effects on blood sugar control.  -Counseled on s/sx of and management of hypoglycemia.  -Next A1c anticipated 08/2024.   ASCVD risk - primary prevention in patient with diabetes. Last LDL is 123 mg/dL, not at goal of <29 mg/dL. ASCVD risk factors include duration of DM (greater than 10 years). May discuss statin therapy at follow up.    Patient verbalized understanding of treatment plan. Total time patient counseling 20 minutes.  Follow-up:  Pharmacist on 09/26/24 PCP clinic visit on 08/23/24  Kristen Ponce, PharmD, CPP Clinical Pharmacist [facility] [phone]   "

## 2024-08-05 NOTE — Patient Instructions (Signed)
 Start Ozempic  0.25 mg weekly for 4 weeks and then

## 2024-08-23 ENCOUNTER — Ambulatory Visit: Admitting: Physician Assistant

## 2024-09-26 ENCOUNTER — Ambulatory Visit

## 2024-10-08 ENCOUNTER — Ambulatory Visit: Admitting: Physician Assistant
